# Patient Record
Sex: Male | Born: 1970
Health system: Southern US, Community
[De-identification: ages and names within clinical notes are randomized; demographics above are authoritative.]

---

## 2017-04-13 MED FILL — TRUE METRIX TEST STRIP: 30 days supply | Qty: 100 | Fill #0

## 2017-04-13 MED FILL — !TRUE METRIX BLOOD GLUCOSE: 365 days supply | Qty: 1 | Fill #0

## 2017-04-13 MED FILL — TRUEplus LANCETS 28G MISC: 25 days supply | Qty: 100 | Fill #0

## 2018-11-18 ENCOUNTER — Emergency Department: Admit: 2018-11-19

## 2018-11-18 DIAGNOSIS — E1165 Type 2 diabetes mellitus with hyperglycemia: Principal | ICD-10-CM

## 2018-11-18 NOTE — ED Notes (Signed)
Thayer Ohm NP at bedside     Jeryl Columbia, RN  11/18/18 2227

## 2018-11-18 NOTE — ED Notes (Signed)
Patient taken to CT via cart by transport.      Allysha Tryon, RN  11/18/18 2252

## 2018-11-18 NOTE — ED Triage Notes (Signed)
Pt arrives via family for c/o altered mental status.  Per family, pt came home from work and was not acting right.  Pt with altered gait to SFT bed.  Pt frequently falling asleep.  Family states pt is diabetic.  oT result 423.  Odor of ETOH/ketones detected.  Family states pt does drink frequently but they are unsure if he has been drinking today.  Family states whenever they ask pt what is wrong, he states "i'm fine".  NP called to bedside immediately.  Facial symmetry equal, bilat hand grasps, push pulls equal and strong.  PERRLA

## 2018-11-18 NOTE — ED Provider Notes (Signed)
[facility]  EMERGENCY DEPARTMENT ENCOUNTER      Pt Name: Todd Morrison  MRN: [id number]  Birthdate [birth year]  Date of evaluation: 11/18/2018  Provider: Suezanne Jacquethristopher Scott Mayleen Borrero, APRN - CNP    CHIEF COMPLAINT       Chief Complaint   Patient presents with   ??? Altered Mental Status     Patient is not a code bat, unknown onset of symptoms, no apparent stroke symptoms, admits to EtOH    HISTORY OF PRESENT ILLNESS   (Location/Symptom, Timing/Onset,Context/Setting, Quality, Duration, Modifying Factors, Severity)  Note limiting factors.   Todd ReichertFransico Morrison is a 48 y.o. male who presents to the emergency department for complaint of altered mental status.  Patient was brought in by family stating that he got home from work around 6 PM at that time was acting strangely appear to be tired and disoriented.  State that he was talking strangely his words were somewhat slurred.  Family present with him states that 1 hour ago the return of waking up because he appeared to be passed out and would mumble repeatedly stating "I am fine".  That he did not concerned about him when his words were very slurred and was hard to understand.  They were able to get him in the car but he appeared to be unsteady with walking.  Patient admits that he has been drinking today and his family states that he drinks daily.  Patient admits that he is had 2 or 3 beers prior to coming home from work.  He denies any pain or discomfort denies recent illnesses or injuries.  He states "I am okay", but is unable to describe any other information about his day.    Nursing Notes were reviewed.    REVIEW OF SYSTEMS    (2-9 systems for level 4, 10 or more for level 5)     Review of Systems   Reason unable to perform ROS: Patient is alert and answering no to most questions states he is feeling sleepy, additional information per family.   Constitutional: Positive for fatigue. Negative for activity change, appetite change, chills, diaphoresis and fever.    HENT: Negative for congestion, ear pain, postnasal drip, rhinorrhea, sore throat and trouble swallowing.    Eyes: Negative for photophobia and visual disturbance.   Respiratory: Negative for cough, chest tightness, shortness of breath and wheezing.    Cardiovascular: Negative for chest pain and palpitations.   Gastrointestinal: Negative for abdominal distention, abdominal pain, diarrhea, nausea and vomiting.   Genitourinary: Negative for difficulty urinating, dysuria, flank pain, frequency and urgency.   Musculoskeletal: Negative for arthralgias, back pain, myalgias, neck pain and neck stiffness.   Skin: Negative for color change and rash.   Neurological: Positive for speech difficulty (slurred words per family) and weakness. Negative for dizziness, tremors, seizures, syncope, light-headedness, numbness and headaches.       Except as noted above the remainder of the review of systems was reviewed and negative.       PAST MEDICAL HISTORY     Past Medical History:   Diagnosis Date   ??? Diabetes mellitus (HCC)      History reviewed. No pertinent surgical history.  Social History     Socioeconomic History   ??? Marital status: Single     Spouse name: None   ??? Number of children: None   ??? Years of education: None   ??? Highest education level: None   Occupational History   ??? None  Social Needs   ??? Financial resource strain: None   ??? Food insecurity     Worry: None     Inability: None   ??? Transportation needs     Medical: None     Non-medical: None   Tobacco Use   ??? Smoking status: Current Some Day Smoker   ??? Smokeless tobacco: Never Used   Substance and Sexual Activity   ??? Alcohol use: Yes   ??? Drug use: Never   ??? Sexual activity: None   Lifestyle   ??? Physical activity     Days per week: None     Minutes per session: None   ??? Stress: None   Relationships   ??? Social Wellsite geologist on phone: None     Gets together: None     Attends religious service: None     Active member of club or organization: None     Attends  meetings of clubs or organizations: None     Relationship status: None   ??? Intimate partner violence     Fear of current or ex partner: None     Emotionally abused: None     Physically abused: None     Forced sexual activity: None   Other Topics Concern   ??? None   Social History Narrative   ??? None       SCREENINGS   NIH Stroke Scale  NIH Stroke Scale Assessed: Yes  Interval: Baseline  Level of Consciousness (1a. ): Not alert, but arousable by minor stimulation to obey  LOC Questions (1b. ): Answers both correctly  LOC Commands (1c. ): Performs both tasks correctly  Best Gaze (2. ): Normal  Visual (3. ): No visual loss  Facial Palsy (4. ): Normal symmetrical movement  Motor Arm, Left (5a. ): No drift  Motor Arm, Right (5b. ): No drift  Motor Leg, Left (6a. ): No drift  Motor Leg, Right (6b. ): No drift  Limb Ataxia (7. ): Absent  Sensory (8. ): Normal  Dysarthria (10. ): Normal  Extinction and Inattention (11): No abnormalityGlasgow Coma Scale  Eye Opening: To speech  Best Verbal Response: Confused  Best Motor Response: Obeys commands  Glasgow Coma Scale Score: 13        PHYSICAL EXAM    (up to 7 for level 4, 8 or more for level 5)     ED Triage Vitals [11/18/18 2228]   BP Temp Temp src Pulse Resp SpO2 Height Weight   130/75 -- -- 81 14 97 %  (1.702 m) 150 lb (68 kg)       Physical Exam  Constitutional:       General: He is not in acute distress.     Appearance: Normal appearance. He is normal weight. He is not ill-appearing, toxic-appearing or diaphoretic.   HENT:      Head: Normocephalic and atraumatic.      Right Ear: External ear normal.      Left Ear: External ear normal.      Nose: Nose normal.      Mouth/Throat:      Mouth: Mucous membranes are moist.   Eyes:      General:         Right eye: No discharge.         Left eye: No discharge.      Conjunctiva/sclera: Conjunctivae normal.      Pupils: Pupils are equal, round, and reactive to light.  Neck:      Musculoskeletal: Normal range of motion and neck  supple. No neck rigidity or muscular tenderness.   Cardiovascular:      Rate and Rhythm: Normal rate and regular rhythm.      Pulses: Normal pulses.   Pulmonary:      Effort: Pulmonary effort is normal.      Breath sounds: Normal breath sounds.   Abdominal:      General: Bowel sounds are normal. There is no distension.      Tenderness: There is no abdominal tenderness.   Musculoskeletal: Normal range of motion.         General: No tenderness or signs of injury.   Skin:     General: Skin is warm and dry.   Neurological:      General: No focal deficit present.      Mental Status: He is alert and oriented to person, place, and time. Mental status is at baseline.      Cranial Nerves: No cranial nerve deficit.      Sensory: No sensory deficit.      Motor: Weakness present.      Coordination: Coordination normal.      Comments: Patient opens eyes to voice appears fatigued generalized weakness but this is equal bilaterally.  He does identify himself correctly.  Speech has a slight slur but there is no facial deviation no noted focal neurological deficit.  He does follow commands but after minute will close his eyes and appears to fall asleep again.  Stroke evaluation proven negative at a 1 only because of the fatigue appearance arousable to voice.  GCS is 13 on arrival no apparent obvious trauma         RESULTS     EKG: All EKG's are interpreted by the Emergency Department Physician who either signs or Co-signsthis chart in the absence of a cardiologist.    Sinus rhythm at 70 bpm no acute ST elevation or deviation no ectopy good R wave progression QTC 437 ms    RADIOLOGY:   Non-plain filmimages such as CT, Ultrasound and MRI are read by the radiologist. Plain radiographic images are visualized and preliminarily interpreted by the emergency physician with the below findings:    CT of the head per stat radiology shows 1.3 x 0.9 cm hypodensity to the right thalamus and posterior limb of internal capsule.  SPECT lacunar  infarct age indeterminate based on density.  No intracranial hemorrhage or mass-effect.    Interpretation per the Radiologist below, if available at the time ofthis note:    XR CHEST PORTABLE    (Results Pending)   CT Head WO Contrast    (Results Pending)         ED BEDSIDE ULTRASOUND:   Performed by ED Physician - none    LABS:  Labs Reviewed   CBC WITH AUTO DIFFERENTIAL - Abnormal; Notable for the following components:       Result Value    Hematocrit 41.9 (*)     All other components within normal limits   COMPREHENSIVE METABOLIC PANEL - Abnormal; Notable for the following components:    Sodium 134 (*)     Chloride 93 (*)     Anion Gap 19 (*)     Glucose 424 (*)     Calcium 10.0 (*)     All other components within normal limits    Narrative:     CALL  [facility] tel. [phone],  Glucose results  called to and read back by [facility] Page, 11/18/2018 23:58, by  Buena Irish   URINE RT REFLEX TO CULTURE - Abnormal; Notable for the following components:    Glucose, Ur >=1000 (*)     All other components within normal limits   CK - Abnormal; Notable for the following components:    Total CK 251 (*)     All other components within normal limits    Narrative:     CALL  [facility]  [facility] tel. [phone],  Glucose results called to and read back by [facility] Page, 11/18/2018 23:58, by  Buena Irish   BETA-HYDROXYBUTYRATE - Abnormal; Notable for the following components:    Beta-Hydroxybutyrate 3.1 (*)     All other components within normal limits   POCT GLUCOSE - Abnormal; Notable for the following components:    POC Glucose 423 (*)     All other components within normal limits   POCT GLUCOSE - Abnormal; Notable for the following components:    POC Glucose 170 (*)     All other components within normal limits   POCT GLUCOSE - Normal   URINE DRUG SCREEN   ETHANOL   TROPONIN   CKMB & RELATIVE PERCENT    Narrative:     CALL  [facility] tel. [phone],  Glucose results called to and read back by Melissa Page, 11/18/2018 23:58, by  Buena Irish       All  other labs were within normal range or not returned as of this dictation.    EMERGENCY DEPARTMENT COURSE and DIFFERENTIAL DIAGNOSIS/MDM:   Vitals:    Vitals:    11/18/18 2228 11/19/18 0047 11/19/18 0122   BP: 130/75 135/88    Pulse: 81 80    Resp: 14 16    Temp:   98.4 ??F (36.9 ??C)   TempSrc:   Oral   SpO2: 97% 98%    Weight: 150 lb (68 kg)     Height:  (1.702 m)           ED Course as of Nov 19 411   Sat Nov 19, 2018   0223 Significant delay of urinalysis and screen due to power outage is in the ER lab area, had to be rebooted for the analyzer   Urine Drug Screen [CS]      ED Course User Index  [CS] Suezanne Jacquet, APRN - CNP     MDM patient is afebrile nontoxic parents no acute distress hemodynamically stable appears lethargic but arouses easily to voice responds with simple 1-2 words.  Patient's stroke screen is grossly negative other than and noted decreased level of alertness with eyes closed appears to be sleeping.  Patient family admits to history of daily drinking and patient admits to having beers today after work.  There is no obvious onset of these symptoms per the family member the patient is denying any symptoms.  Code bat was not called as the patient appears to have no obvious stroke symptoms or focal neurological deficit other than being fatigued.  There is no obvious onset to reference.  Due to the patient's apparent altered mental status CT evaluation was performed along with labs.  Patient CT shows an age-indeterminate possible lacunar infarct in the thalamus region no other acute notable changes no strict report of obvious infarct.  Patient's lab work returned with a positive EtOH level.  There are no further drugs in the DAU.  There is no obvious signs of infection.  There is however  noted elevation of blood glucose over 400.  The patient does have a history of diabetes but is not on insulin.  Patient was arousable enough to confirm that he is diabetic does not take insulin and  was on pills but has not been on them for some time.  He is unsure when he last had them.  Patient is on IV fluids and IV insulin with a significant reduction to the glucose down to 170.  On each reevaluation the patient appears sleepy but readily wakes to voice answers question with equal bilateral movements.  Stroke scale is repeatedly negative.  There was a delay in processing the patient's urine therefore there is a significant delay in the disposition decision.  Once all labs are completed patient reevaluated he wakes readily is alert and oriented x4 understands most English is able to answer questions appropriately.  Follows all commands well.  He is able to stand upright and ambulate without noted focal neurological deficits and no obvious discomfort or disability.  Patient is repeatedly requesting to be discharged home and his family states that he is normal and they would like to go home.  Patient does appear stable for discharge home and will be referred to follow-up with endocrinology specialist and provided with a contact for neurology if the patient's family believes that he is experiencing repeated deficits or changes.  Patient and family are directed to immediately return to the ER for any onset of new concerning symptoms worsening condition or any signs of severe neurological deficits or disorders.  Patient and family verbalized understanding of all given instructions and education.  They are able to ambulate with exit with a strong steady upright gait.    CRITICAL CARE TIME       CONSULTS:  None    PROCEDURES:  Unless otherwise noted below, none     Procedures    FINAL IMPRESSION      1. Acute alcoholic intoxication without complication (HCC)    2. Type 2 diabetes mellitus with hyperglycemia, without long-term current use of insulin (HCC)    3. Dizziness          DISPOSITION/PLAN   DISPOSITION        PATIENT REFERRED TO:  Kris Hartmann, MD  [address]  [address]  [place]  [address]  [phone]    Call in 1 day  To follow-up established with an endocrinologist for your diabetes    Wynelle St. John, MD  [address]  [address]  [place] [address]  [phone]    Call in 1 day  To follow-up with a neurologist for your dizziness and CT scan      DISCHARGE MEDICATIONS:  There are no discharge medications for this patient.         (Please notethat portions of this note were completed with a voice recognition program.  Efforts were made to edit the dictations but occasionally words are mis-transcribed.)    Suezanne Jacquet, APRN - CNP (electronically signed)  Attending Emergency Physician         Suezanne Jacquet, APRN - CNP  11/19/18 0412       Suezanne Jacquet, APRN - CNP  11/19/18 0413

## 2018-11-19 ENCOUNTER — Inpatient Hospital Stay: Admit: 2018-11-19 | Discharge: 2018-11-19 | Disposition: A | Discharge status: Home / Self Care

## 2018-11-19 LAB — ETHANOL
Ethanol Lvl: 110 mg/dL
Ethanol percent: 0.096 G/dL

## 2018-11-19 LAB — CBC WITH AUTO DIFFERENTIAL
Basophils %: 0.8 %
Basophils Absolute: 0 10*3/uL (ref 0.0–0.2)
Eosinophils %: 3.2 %
Eosinophils Absolute: 0.2 10*3/uL (ref 0.0–0.7)
Hematocrit: 41.9 % — ABNORMAL LOW (ref 42.0–52.0)
Hemoglobin: 14.4 g/dL (ref 14.0–18.0)
Lymphocytes %: 41.9 %
Lymphocytes Absolute: 2.3 10*3/uL (ref 1.0–4.8)
MCH: 30.3 pg (ref 27.0–31.3)
MCHC: 34.4 % (ref 33.0–37.0)
MCV: 88.1 fL (ref 80.0–100.0)
Monocytes %: 6.7 %
Monocytes Absolute: 0.4 10*3/uL (ref 0.2–0.8)
Neutrophils %: 47.4 %
Neutrophils Absolute: 2.6 10*3/uL (ref 1.4–6.5)
Platelets: 175 10*3/uL (ref 130–400)
RBC: 4.76 M/uL (ref 4.70–6.10)
RDW: 12.2 % (ref 11.5–14.5)
WBC: 5.4 10*3/uL (ref 4.8–10.8)

## 2018-11-19 LAB — COMPREHENSIVE METABOLIC PANEL
ALT: 18 U/L (ref 0–41)
AST: 16 U/L (ref 0–40)
Albumin: 4.5 g/dL (ref 3.5–4.6)
Alkaline Phosphatase: 74 U/L (ref 35–104)
Anion Gap: 19 mEq/L — ABNORMAL HIGH (ref 9–15)
BUN: 14 mg/dL (ref 6–20)
CO2: 22 mEq/L (ref 20–31)
Calcium: 10 mg/dL — ABNORMAL HIGH (ref 8.5–9.9)
Chloride: 93 mEq/L — ABNORMAL LOW (ref 95–107)
Creatinine: 1.13 mg/dL (ref 0.70–1.20)
GFR African American: >60 (ref >60)
GFR Non-African American: >60 (ref >60)
Globulin: 2.7 g/dL (ref 2.3–3.5)
Glucose: 424 mg/dL (ref 70–99)
Potassium: 3.8 mEq/L (ref 3.4–4.9)
Sodium: 134 mEq/L — ABNORMAL LOW (ref 135–144)
Total Bilirubin: 0.4 mg/dL (ref 0.2–0.7)
Total Protein: 7.2 g/dL (ref 6.3–8.0)

## 2018-11-19 LAB — CKMB & RELATIVE PERCENT
CK-MB Index: 1.4 % (ref 0.0–3.5)
CK-MB: 3.4 ng/mL (ref 0.0–6.7)

## 2018-11-19 LAB — EKG 12-LEAD
Atrial Rate: 78 {beats}/min
P Axis: 22 degrees
P-R Interval: 166 ms
Q-T Interval: 384 ms
QRS Duration: 98 ms
QTc Calculation (Bazett): 437 ms
R Axis: 69 degrees
T Axis: 62 degrees
Ventricular Rate: 78 {beats}/min

## 2018-11-19 LAB — URINE DRUG SCREEN
Amphetamine Screen, Urine: NEGATIVE (ref <1000)
Barbiturate Screen, Ur: NEGATIVE (ref <200)
Benzodiazepine Screen, Urine: NEGATIVE (ref <200)
Cannabinoid Scrn, Ur: NEGATIVE (ref <50)
Cocaine Metabolite Screen, Urine: NEGATIVE (ref <300)
Methadone Screen, Urine: NEGATIVE (ref <300)
Opiate Scrn, Ur: NEGATIVE (ref <300)
Oxycodone Urine: NEGATIVE (ref <100)
PCP Screen, Urine: NEGATIVE (ref <25)
Propoxyphene Scrn, Ur: NEGATIVE (ref <300)

## 2018-11-19 LAB — POCT GLUCOSE
Glucose: 423 mg/dL
POC Glucose: 423 mg/dl (ref 60–115)
POC Glucose: 170 mg/dl — ABNORMAL HIGH (ref 60–115)

## 2018-11-19 LAB — URINALYSIS WITH REFLEX TO CULTURE
Bilirubin Urine: NEGATIVE
Blood, Urine: NEGATIVE
Glucose, Ur: >=1000 mg/dL — AB
Ketones, Urine: NEGATIVE mg/dL
Leukocyte Esterase, Urine: NEGATIVE
Nitrite, Urine: NEGATIVE
Protein, UA: NEGATIVE mg/dL
Specific Gravity, UA: 1.018 (ref 1.005–1.030)
Urobilinogen, Urine: 0.2 E.U./dL (ref <2.0)
pH, UA: 5 (ref 5.0–9.0)

## 2018-11-19 LAB — TROPONIN: Troponin: <0.01 ng/mL (ref 0.000–0.010)

## 2018-11-19 LAB — BETA-HYDROXYBUTYRATE: Beta-Hydroxybutyrate: 3.1 mg/dL — ABNORMAL HIGH (ref 0.2–2.8)

## 2018-11-19 LAB — CK: Total CK: 251 U/L — ABNORMAL HIGH (ref 0–190)

## 2018-11-19 MED ORDER — SODIUM CHLORIDE 0.9 % IV BOLUS
0.9 % | Freq: Once | INTRAVENOUS | Status: AC
Start: 2018-11-19 — End: 2018-11-19
  Administered 2018-11-19: 03:00:00 1000 mL via INTRAVENOUS

## 2018-11-19 MED ORDER — INSULIN REGULAR HUMAN 100 UNIT/ML IJ SOLN
100 UNIT/ML | Freq: Once | INTRAMUSCULAR | Status: AC
Start: 2018-11-19 — End: 2018-11-19
  Administered 2018-11-19: 05:00:00 5 [IU] via INTRAVENOUS

## 2018-11-19 NOTE — ED Notes (Signed)
Pt is s leeping with his son at bedside. Pt's sone explains the events prior to pt coming in. He is questioning if his father will be admitted tonight.      Neila Gear, RN  11/19/18 [phone]

## 2019-09-21 MED FILL — BYDUREON 2 MG PEN INJECT: 2 | 28 days supply | Qty: 4 | Fill #0

## 2019-09-21 MED FILL — METFORMIN HCL ER 500 MG TB2: 500 | 30 days supply | Qty: 120 | Fill #0

## 2019-10-20 MED FILL — BYDUREON 2 MG PEN INJECT: 2 | 28 days supply | Qty: 4 | Fill #1

## 2019-10-20 MED FILL — METFORMIN HCL ER 500 MG TB2: 500 | 30 days supply | Qty: 120 | Fill #1

## 2019-11-21 IMAGING — US US RENAL
1 series · 14 of 25 positions shown · non-contrast
Comparison: None.

CLINICAL DATA: Acute kidney injury.

EXAM:
RENAL / URINARY TRACT ULTRASOUND COMPLETE

[Series 1: us renal · 14 of 37 slices shown]
[im 1/37]
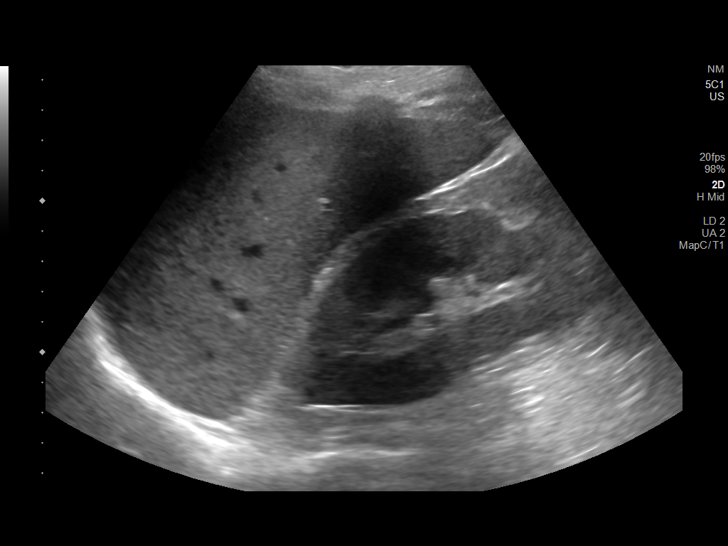
[im 4/37]
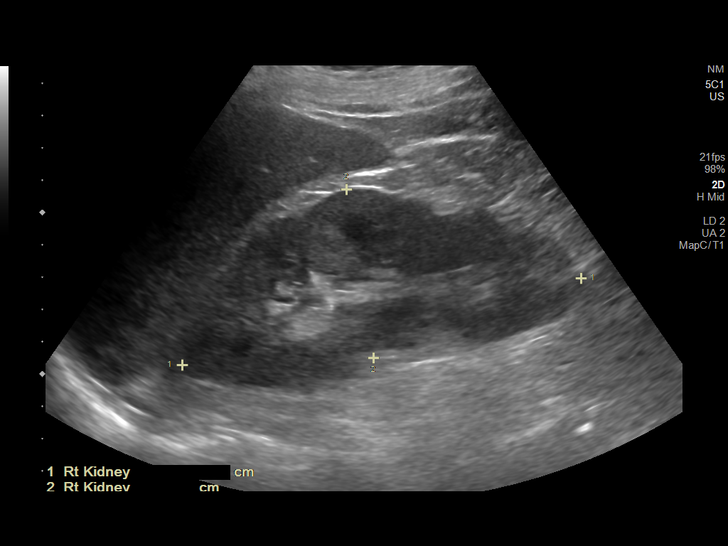
[im 7/37]
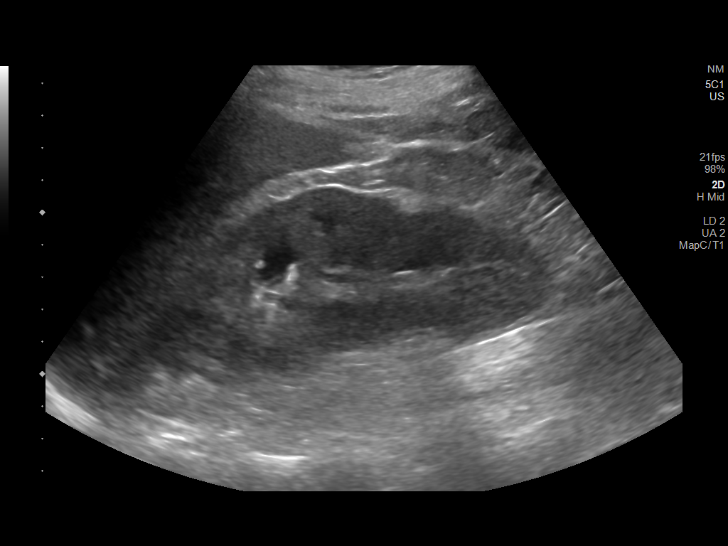
[im 10/37]
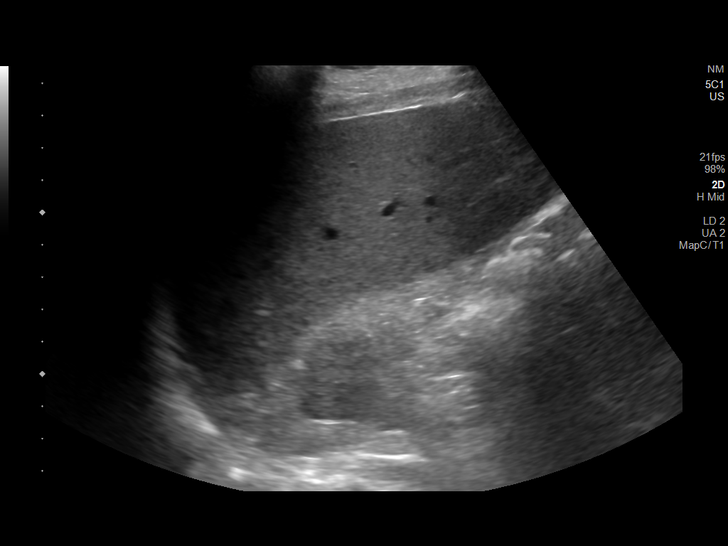
[im 13/37]
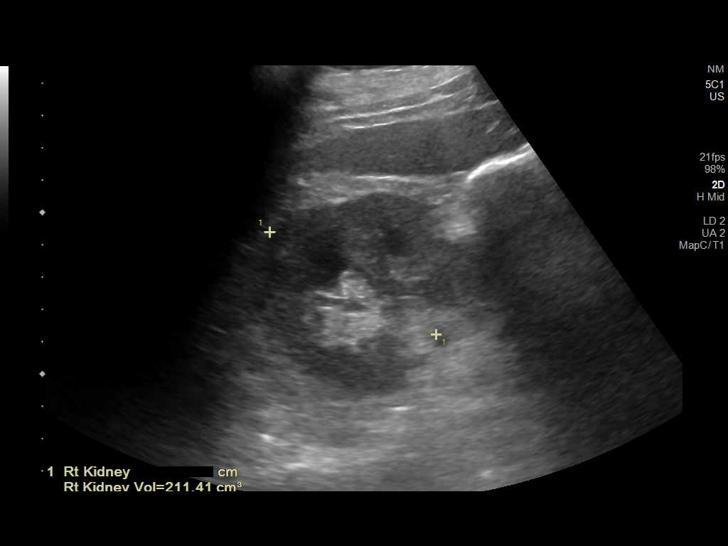
[im 14/37]
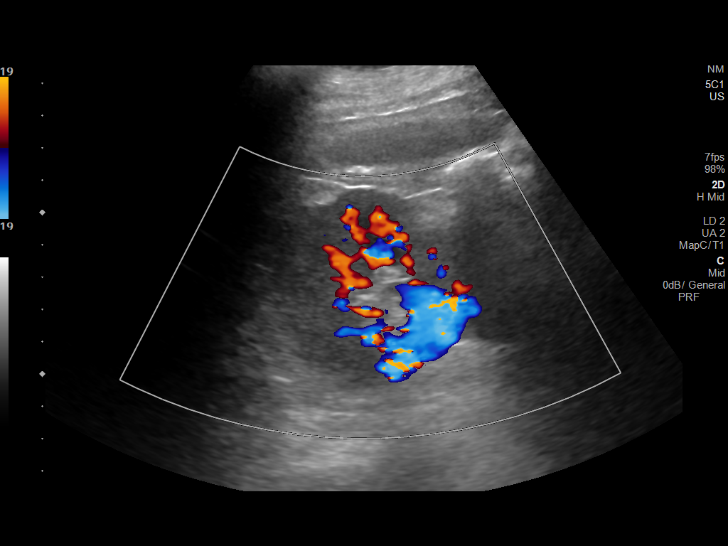
[im 17/37]
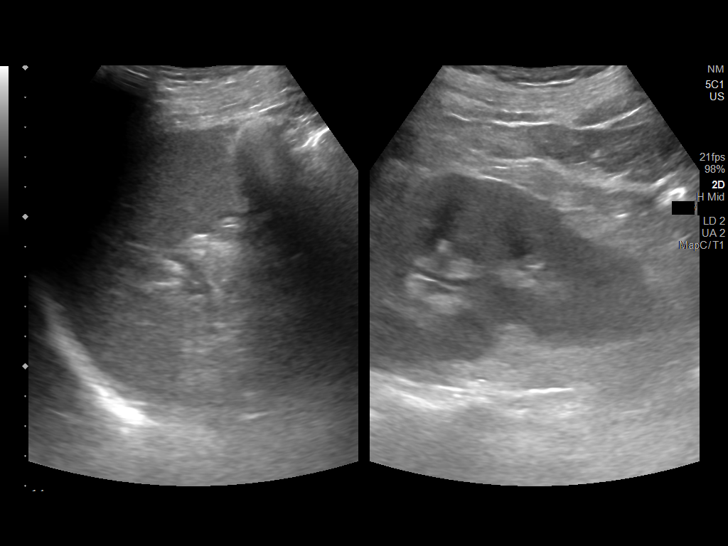
[im 20/37]
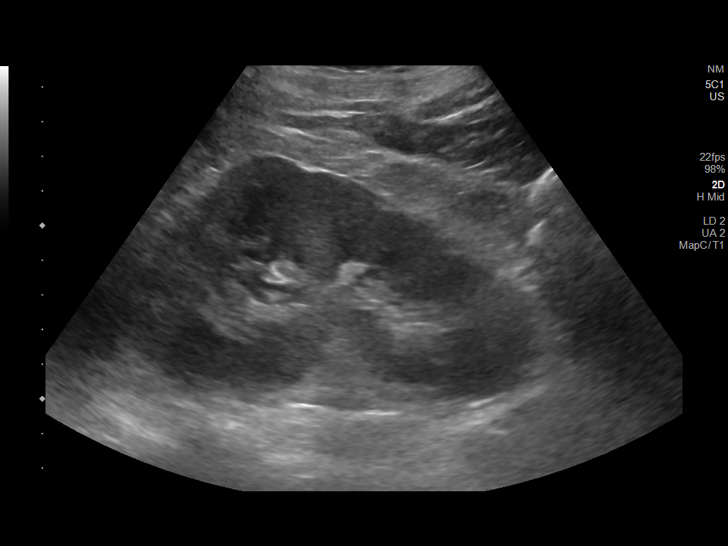
[im 23/37]
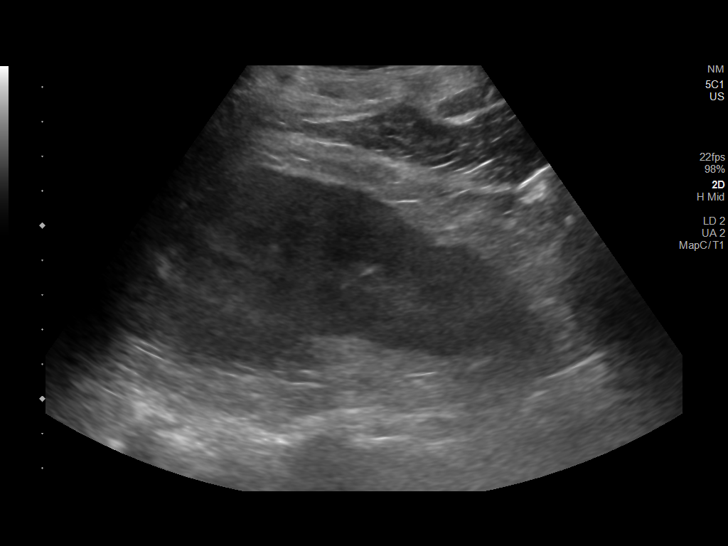
[im 25/37]
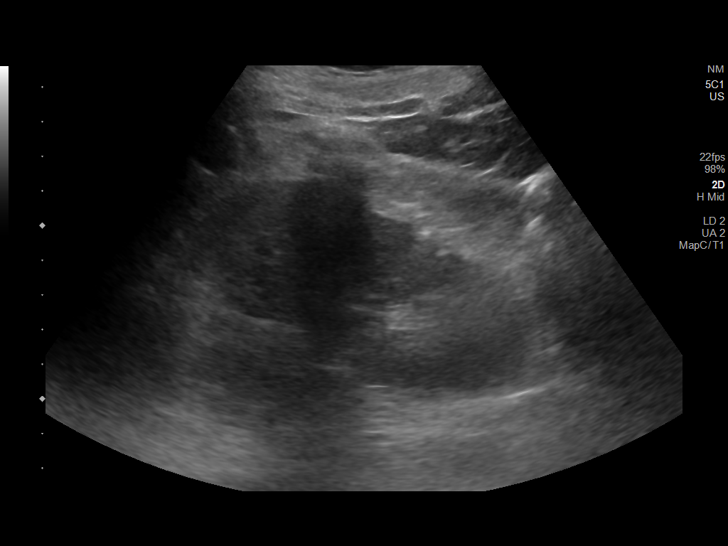
[im 28/37]
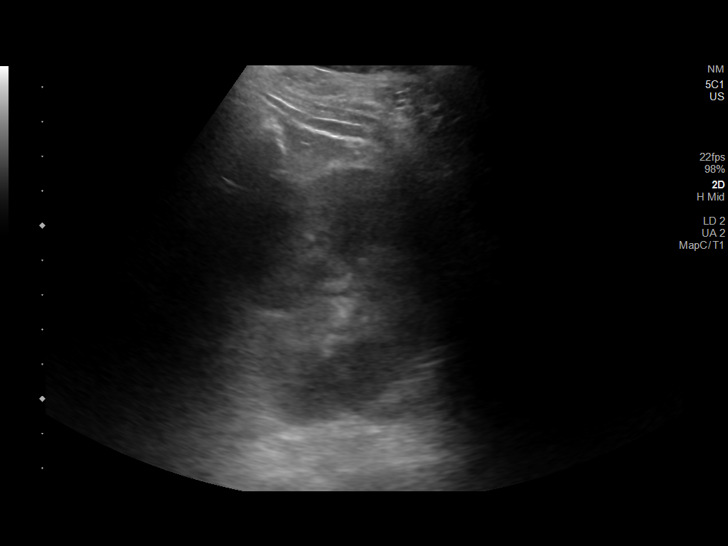
[im 31/37]
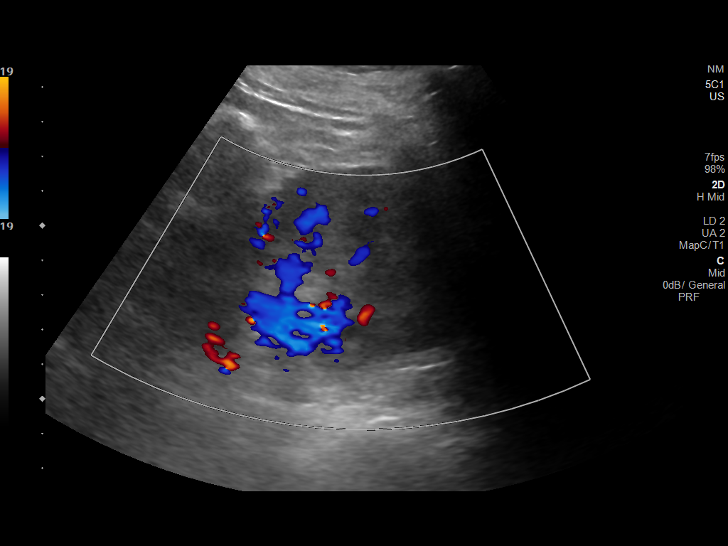
[im 34/37]
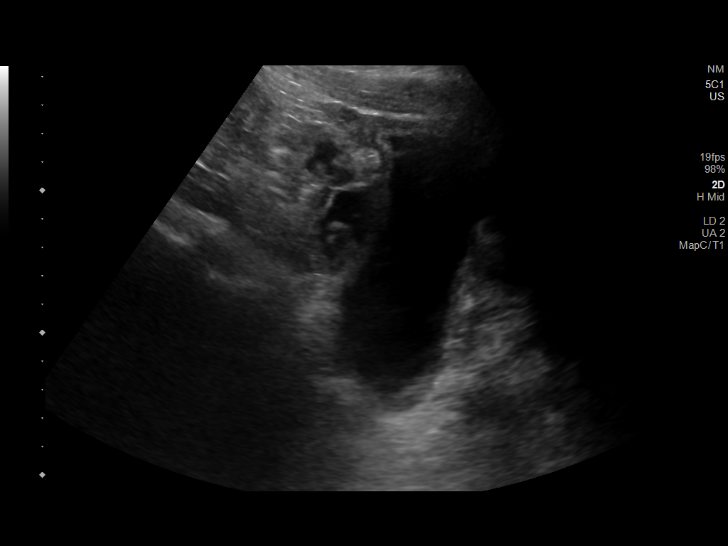
[im 37/37]
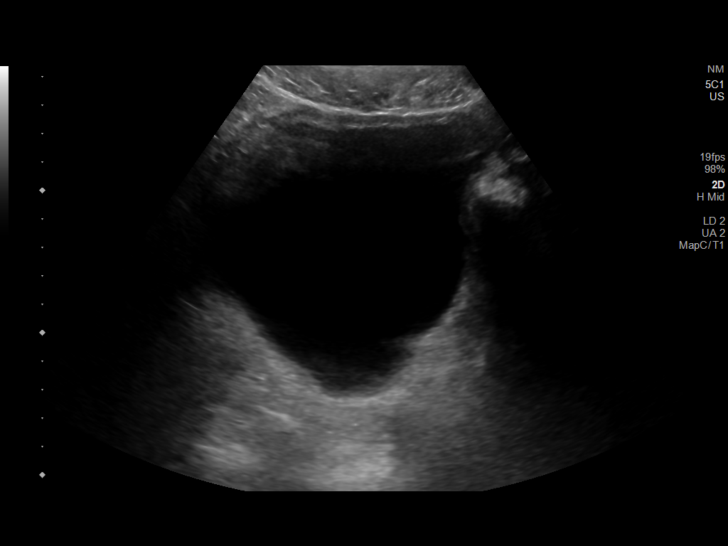

[14 of 25 positions shown; findings below may reference images not displayed]

FINDINGS: Right Kidney:

Renal measurements: 2.7 x 5.3 x 6.1 cm = volume: 211 mL.
Echogenicity within normal limits. No mass or hydronephrosis
visualized.

Left Kidney:

Renal measurements: 11.9 x 6.2 x 5.5 cm = volume: 213 mL.
Echogenicity within normal limits. No mass or hydronephrosis
visualized.

Bladder:

Appears normal for degree of bladder distention. Neither ureteral
jet was visualized.

Other:

None.
IMPRESSION: Unremarkable sonographic appearance of the kidneys and bladder.

## 2019-11-28 MED FILL — METFORMIN HCL ER 500 MG TB2: 500 | 30 days supply | Qty: 120 | Fill #2

## 2019-11-29 MED FILL — VICTOZA 18 MG/3 ML INJECT P: 18 | 30 days supply | Qty: 9 | Fill #0

## 2019-11-29 MED FILL — UNIFINE PENTIPS 32GX5/32: 32G X 4 MM | 30 days supply | Qty: 100 | Fill #0

## 2020-01-06 IMAGING — DX DG FOOT 2V*L*
2 series · 2 of 2 positions shown · non-contrast
Comparison: None.

CLINICAL DATA: Injury to plantar surface.

EXAM:
LEFT FOOT - 2 VIEW

[foot ap]
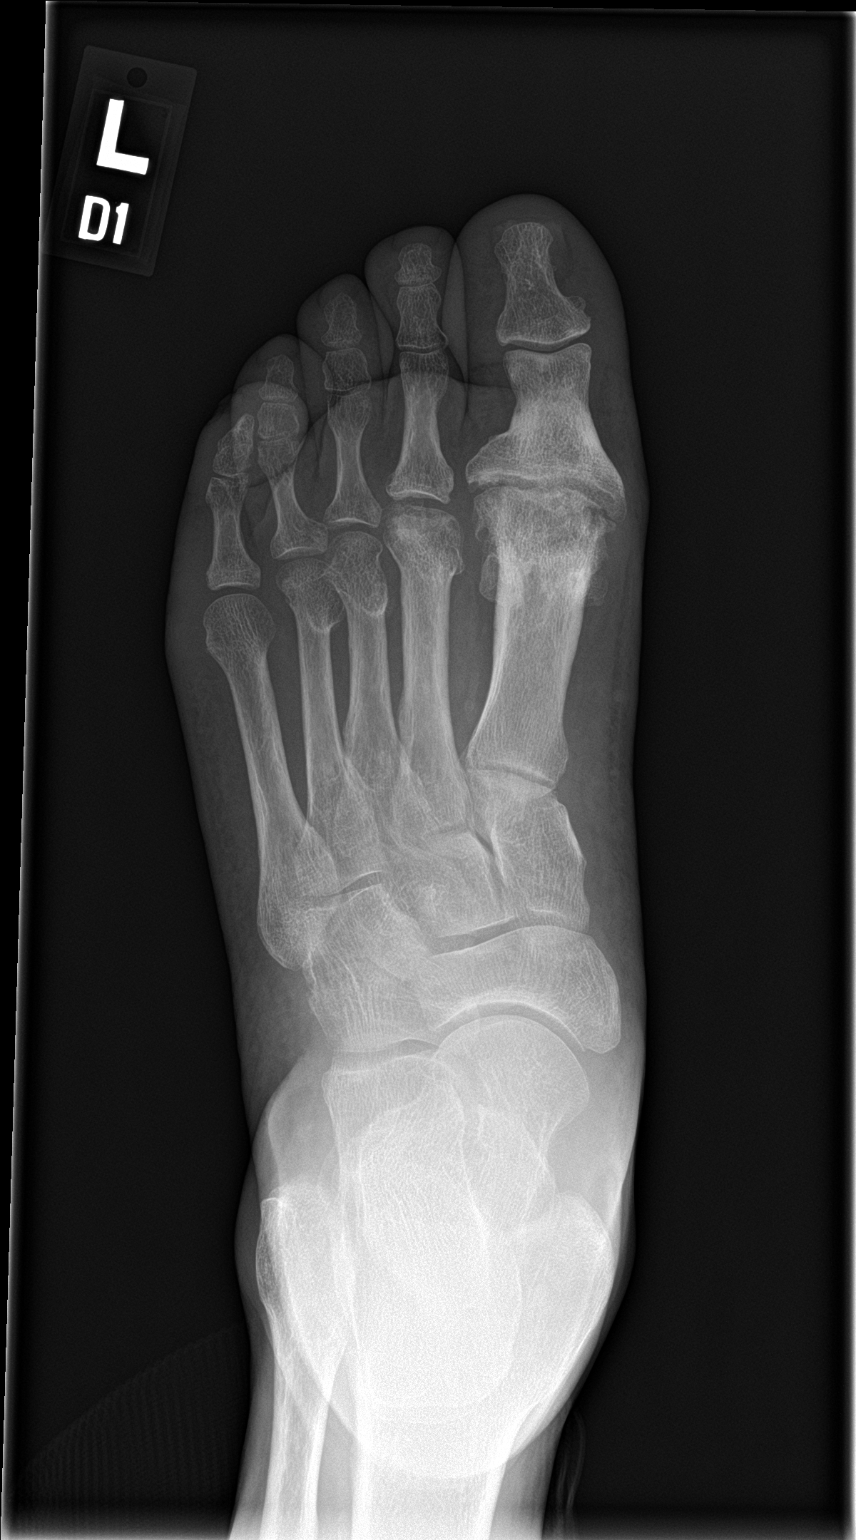

[foot lat]
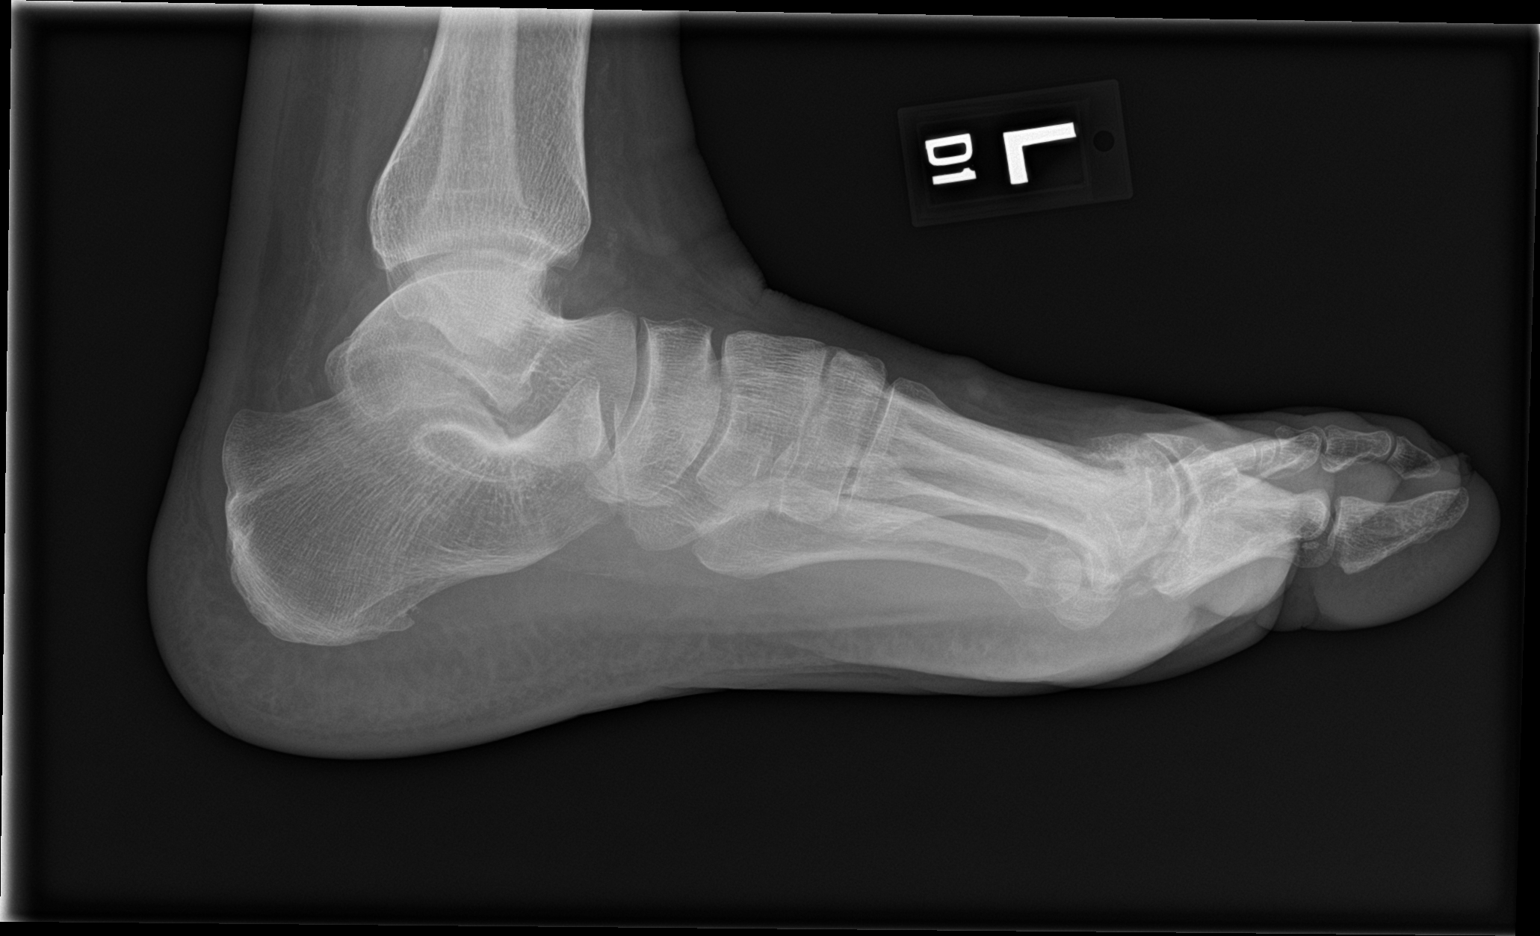

[2 of 2 positions shown; findings below may reference images not displayed]

FINDINGS: Advanced degenerative changes at the 1st MTP joint. No acute bony
abnormality. Specifically, no fracture, subluxation, or dislocation.
No radiopaque foreign body.
IMPRESSION: No acute bony abnormality.

## 2020-01-12 MED FILL — METFORMIN HCL ER 500 MG TB2: 500 | 30 days supply | Qty: 120 | Fill #3

## 2020-01-12 MED FILL — UNIFINE PENTIPS 32GX5/32: 32G X 4 MM | 30 days supply | Qty: 100 | Fill #1

## 2020-01-12 MED FILL — VICTOZA 18 MG/3 ML INJECT P: 18 | 30 days supply | Qty: 9 | Fill #1

## 2020-03-01 MED FILL — VICTOZA 18 MG/3 ML INJECT P: 18 | 30 days supply | Qty: 9 | Fill #2

## 2020-03-01 MED FILL — METFORMIN HCL ER 500 MG TB2: 500 | 30 days supply | Qty: 120 | Fill #4

## 2020-03-28 MED FILL — VICTOZA 2-PAK 18 MG/3 ML PE: 18 | 30 days supply | Qty: 6 | Fill #0

## 2020-04-01 MED FILL — METFORMIN HCL ER 500 MG TB2: 500 | 30 days supply | Qty: 120 | Fill #5

## 2020-05-13 MED FILL — METFORMIN HCL ER 500 MG TB2: 500 | 30 days supply | Qty: 120 | Fill #6

## 2020-05-13 MED FILL — VICTOZA 2-PAK 18 MG/3 ML PE: 18 | 30 days supply | Qty: 6 | Fill #1

## 2020-05-14 MED FILL — UNIFINE PENTIPS 32GX5/32: 32G X 4 MM | 30 days supply | Qty: 100 | Fill #0

## 2020-06-06 MED FILL — METFORMIN HCL ER 500 MG TB2: 500 | 30 days supply | Qty: 120 | Fill #7

## 2020-06-06 MED FILL — VICTOZA 2-PAK 18 MG/3 ML PE: 18 | 30 days supply | Qty: 6 | Fill #2

## 2020-07-17 MED FILL — METFORMIN HCL ER 500 MG TB2: 500 | 30 days supply | Qty: 120 | Fill #8

## 2020-07-17 MED FILL — VICTOZA 2-PAK 18 MG/3 ML PE: 18 | 30 days supply | Qty: 6 | Fill #3

## 2020-08-14 MED FILL — METFORMIN HCL ER 500 MG TB2: 500 | 30 days supply | Qty: 120 | Fill #9

## 2020-08-19 MED FILL — VICTOZA 2-PAK 18 MG/3 ML PE: 18 | 30 days supply | Qty: 6 | Fill #0
